# Patient Record
Sex: Male | Born: 1970 | Race: White | Hispanic: No | Marital: Married | Smoking: Never smoker
Health system: Southern US, Community
[De-identification: ages and names within clinical notes are randomized; demographics above are authoritative.]

## PROBLEM LIST (undated history)

## (undated) DIAGNOSIS — R3915 Urgency of urination: Secondary | ICD-10-CM

## (undated) DIAGNOSIS — E0590 Thyrotoxicosis, unspecified without thyrotoxic crisis or storm: Secondary | ICD-10-CM

## (undated) DIAGNOSIS — Z87442 Personal history of urinary calculi: Secondary | ICD-10-CM

## (undated) DIAGNOSIS — K5900 Constipation, unspecified: Secondary | ICD-10-CM

## (undated) HISTORY — PX: NO PAST SURGERIES: SHX2092

---

## 2018-10-24 ENCOUNTER — Encounter (HOSPITAL_COMMUNITY): Payer: Self-pay | Admitting: Emergency Medicine

## 2018-10-24 ENCOUNTER — Emergency Department (HOSPITAL_COMMUNITY)
Admission: EM | Admit: 2018-10-24 | Discharge: 2018-10-24 | Disposition: A | Discharge status: Home / Self Care | Payer: Managed Care, Other (non HMO) | Attending: Emergency Medicine | Admitting: Emergency Medicine

## 2018-10-24 ENCOUNTER — Other Ambulatory Visit: Payer: Self-pay

## 2018-10-24 ENCOUNTER — Emergency Department (HOSPITAL_COMMUNITY): Payer: Managed Care, Other (non HMO)

## 2018-10-24 DIAGNOSIS — R1032 Left lower quadrant pain: Secondary | ICD-10-CM | POA: Diagnosis present

## 2018-10-24 DIAGNOSIS — N130 Hydronephrosis with ureteropelvic junction obstruction: Secondary | ICD-10-CM | POA: Diagnosis not present

## 2018-10-24 DIAGNOSIS — R3129 Other microscopic hematuria: Secondary | ICD-10-CM

## 2018-10-24 DIAGNOSIS — E079 Disorder of thyroid, unspecified: Secondary | ICD-10-CM | POA: Insufficient documentation

## 2018-10-24 DIAGNOSIS — R112 Nausea with vomiting, unspecified: Secondary | ICD-10-CM | POA: Diagnosis not present

## 2018-10-24 DIAGNOSIS — N201 Calculus of ureter: Secondary | ICD-10-CM | POA: Insufficient documentation

## 2018-10-24 HISTORY — DX: Thyrotoxicosis, unspecified without thyrotoxic crisis or storm: E05.90

## 2018-10-24 LAB — COMPREHENSIVE METABOLIC PANEL
ALT: 41 U/L (ref 0–44)
AST: 25 U/L (ref 15–41)
Albumin: 4.5 g/dL (ref 3.5–5.0)
Alkaline Phosphatase: 51 U/L (ref 38–126)
Anion gap: 12 (ref 5–15)
BUN: 15 mg/dL (ref 6–20)
CO2: 24 mmol/L (ref 22–32)
Calcium: 9.4 mg/dL (ref 8.9–10.3)
Chloride: 103 mmol/L (ref 98–111)
Creatinine, Ser: 1.1 mg/dL (ref 0.61–1.24)
GFR calc Af Amer: >60 mL/min (ref >60)
GFR calc non Af Amer: >60 mL/min (ref >60)
Glucose, Bld: 103 mg/dL — ABNORMAL HIGH (ref 70–99)
Potassium: 3.7 mmol/L (ref 3.5–5.1)
Sodium: 139 mmol/L (ref 135–145)
Total Bilirubin: 2.3 mg/dL — ABNORMAL HIGH (ref 0.3–1.2)
Total Protein: 7.7 g/dL (ref 6.5–8.1)

## 2018-10-24 LAB — CBC
HCT: 48.3 % (ref 39.0–52.0)
Hemoglobin: 16.5 g/dL (ref 13.0–17.0)
MCH: 30.2 pg (ref 26.0–34.0)
MCHC: 34.2 g/dL (ref 30.0–36.0)
MCV: 88.3 fL (ref 80.0–100.0)
Platelets: 249 10*3/uL (ref 150–400)
RBC: 5.47 MIL/uL (ref 4.22–5.81)
RDW: 12.2 % (ref 11.5–15.5)
WBC: 12.6 10*3/uL — ABNORMAL HIGH (ref 4.0–10.5)
nRBC: 0 % (ref 0.0–0.2)

## 2018-10-24 LAB — URINALYSIS, ROUTINE W REFLEX MICROSCOPIC
Bacteria, UA: NONE SEEN
Bilirubin Urine: NEGATIVE
Glucose, UA: NEGATIVE mg/dL
Ketones, ur: NEGATIVE mg/dL
Leukocytes,Ua: NEGATIVE
Nitrite: NEGATIVE
Protein, ur: NEGATIVE mg/dL
Specific Gravity, Urine: 1.01 (ref 1.005–1.030)
pH: 5 (ref 5.0–8.0)

## 2018-10-24 LAB — LIPASE, BLOOD: Lipase: 27 U/L (ref 11–51)

## 2018-10-24 MED ORDER — KETOROLAC TROMETHAMINE 30 MG/ML IJ SOLN
30.0000 mg | Freq: Once | INTRAMUSCULAR | Status: AC
  Administered 2018-10-24: 05:00:00 30 mg via INTRAVENOUS
  Filled 2018-10-24: qty 1

## 2018-10-24 MED ORDER — MORPHINE SULFATE (PF) 4 MG/ML IV SOLN
4.0000 mg | Freq: Once | INTRAVENOUS | Status: DC
  Filled 2018-10-24: qty 1

## 2018-10-24 MED ORDER — TAMSULOSIN HCL 0.4 MG PO CAPS: 0.4000 mg | ORAL_CAPSULE | Freq: Every day | ORAL | 0 refills | Status: AC

## 2018-10-24 MED ORDER — ONDANSETRON 4 MG PO TBDP: 4.0000 mg | ORAL_TABLET | Freq: Three times a day (TID) | ORAL | 0 refills | Status: AC | PRN

## 2018-10-24 MED ORDER — SODIUM CHLORIDE 0.9 % IV BOLUS
1000.0000 mL | Freq: Once | INTRAVENOUS | Status: AC
  Administered 2018-10-24: 1000 mL via INTRAVENOUS

## 2018-10-24 MED ORDER — ONDANSETRON HCL 4 MG/2ML IJ SOLN
4.0000 mg | Freq: Once | INTRAMUSCULAR | Status: AC
  Administered 2018-10-24: 4 mg via INTRAVENOUS
  Filled 2018-10-24: qty 2

## 2018-10-24 MED ORDER — IOHEXOL 300 MG/ML  SOLN
100.0000 mL | Freq: Once | INTRAMUSCULAR | Status: AC | PRN
  Administered 2018-10-24: 100 mL via INTRAVENOUS

## 2018-10-24 MED ORDER — OXYCODONE-ACETAMINOPHEN 5-325 MG PO TABS: 1.0000 | ORAL_TABLET | ORAL | 0 refills | Status: AC | PRN

## 2018-10-24 NOTE — ED Triage Notes (Signed)
C/O of left sided flank pain with radiation into abdomen. Denies injury. No hx of kidney stones. States it feels like cramping and bloating in his stomach.

## 2018-10-24 NOTE — ED Notes (Signed)
Taken to CT at this time. 

## 2018-10-24 NOTE — Discharge Instructions (Signed)
Take the prescribed medication as directed.  Do not drive while taking pain medications. Follow-up with urology clinic-- can call for appt. Return to the ED for new or worsening symptoms-- uncontrolled pain, vomiting, fever, inability to urinate, etc.

## 2018-10-24 NOTE — ED Provider Notes (Signed)
[facility]   CSN: [id number] Arrival date & time: 10/24/18  0230    History   Chief Complaint Chief Complaint  Patient presents with   Abdominal Pain    HPI Austin Mcdowell is a 48 y.o. male.     The history is provided by the patient and medical records.  Abdominal Pain  Associated symptoms: nausea and vomiting     48 y.o. M with history of hyperthyroidism, presenting to the ED with left lower abdominal pain.  Patient reports this actually began on Monday, about 6 days ago.  Pain is been waxing and waning since that time but more constant over the past 24 hours.  Pain localized to left lower abdomen and into the left back.  States he tried modifying diet so had broth, crackers, and mostly liquids for about 2 days but did not really seem to help very much.  He has not had a BM in about 3 days.  He did vomit once earlier today.  He denies fever, chills, urinary symptoms.  No prior abdominal history or surgeries.  He tried taking some gas-x and pepto without much relief.  Did an e-visit with physician and was concerned about diverticulitis.  Past Medical History:  Diagnosis Date   Hyperthyroidism     There are no active problems to display for this patient.   History reviewed. No pertinent surgical history.      Home Medications    Prior to Admission medications   Not on File    Family History History reviewed. No pertinent family history.  Social History Social History   Tobacco Use   Smoking status: Never Smoker   Smokeless tobacco: Never Used  Substance Use Topics   Alcohol use: Yes    Comment: occas   Drug use: Never     Allergies   Patient has no known allergies.   Review of Systems Review of Systems  Gastrointestinal: Positive for abdominal pain, nausea and vomiting.  All other systems reviewed and are negative.    Physical Exam Updated Vital Signs Temp 97.8 F (36.6 C) (Oral)     Ht 6\' 2"  (1.88 m)    Wt 97.5 kg    BMI 27.60 kg/m   Physical Exam Vitals signs and nursing note reviewed.  Constitutional:      Appearance: He is well-developed.  HENT:     Head: Normocephalic and atraumatic.  Eyes:     Conjunctiva/sclera: Conjunctivae normal.     Pupils: Pupils are equal, round, and reactive to light.  Neck:     Musculoskeletal: Normal range of motion.  Cardiovascular:     Rate and Rhythm: Normal rate and regular rhythm.     Heart sounds: Normal heart sounds.  Pulmonary:     Effort: Pulmonary effort is normal.     Breath sounds: Normal breath sounds.  Abdominal:     General: Bowel sounds are normal.     Palpations: Abdomen is soft.     Tenderness: There is abdominal tenderness in the left lower quadrant.    Musculoskeletal: Normal range of motion.  Skin:    General: Skin is warm and dry.  Neurological:     Mental Status: He is alert and oriented to person, place, and time.      ED Treatments / Results  Labs (all labs ordered are listed, but only abnormal results are displayed) Labs Reviewed  COMPREHENSIVE METABOLIC PANEL - Abnormal; Notable for the following components:  Result Value   Glucose, Bld 103 (*)    Total Bilirubin 2.3 (*)    All other components within normal limits  CBC - Abnormal; Notable for the following components:   WBC 12.6 (*)    All other components within normal limits  URINALYSIS, ROUTINE W REFLEX MICROSCOPIC - Abnormal; Notable for the following components:   Hgb urine dipstick LARGE (*)    All other components within normal limits  LIPASE, BLOOD    EKG None  Radiology Ct Abdomen Pelvis W Contrast  Result Date: 10/24/2018 CLINICAL DATA:  48 y/o  M; left-sided flank pain. EXAM: CT ABDOMEN AND PELVIS WITH CONTRAST TECHNIQUE: Multidetector CT imaging of the abdomen and pelvis was performed using the standard protocol following bolus administration of intravenous contrast. CONTRAST:  OMNIPAQUE IOHEXOL 300  MG/ML  SOLN COMPARISON:  None. FINDINGS: Lower chest: No acute abnormality. Hepatobiliary: No focal liver abnormality is seen. No gallstones, gallbladder wall thickening, or biliary dilatation. Pancreas: Unremarkable. No pancreatic ductal dilatation or surrounding inflammatory changes. Spleen: Normal in size without focal abnormality. Adrenals/Urinary Tract: Mild left-sided hydronephrosis with a 4 mm stone at the ureteropelvic junction. Additional punctate non-obstructing stone within the left mid kidney. No stone is present downstream within the ureters or within the bladder. Adrenal glands are normal. No focal kidney lesion. Stomach/Bowel: Stomach is within normal limits. Appendix appears normal. No evidence of bowel wall thickening, distention, or inflammatory changes. Vascular/Lymphatic: Aortic atherosclerosis. No enlarged abdominal or pelvic lymph nodes. Reproductive: Prostate is unremarkable. Other: No abdominal wall hernia or abnormality. No abdominopelvic ascites. Musculoskeletal: No fracture is seen. Moderate discogenic degenerative changes at the L5-S1 level with loss of intervertebral disc space height and endplate marginal osteophytes. IMPRESSION: 1. Mild left-sided hydronephrosis with a 4 mm stone at the ureteropelvic junction. 2. Punctate nonobstructing stone in the left mid kidney. 3.  Aortic Atherosclerosis (ICD10-I70.0). Electronically Signed   By: Mitzi Hansen M.D.   On: 10/24/2018 05:54    Procedures Procedures (including critical care time)  Medications Ordered in ED Medications  morphine 4 MG/ML injection 4 mg (4 mg Intravenous Not Given 10/24/18 0321)  ondansetron (ZOFRAN) injection 4 mg (4 mg Intravenous Given 10/24/18 0324)  sodium chloride 0.9 % bolus 1,000 mL (0 mLs Intravenous Stopped 10/24/18 0613)  iohexol (OMNIPAQUE) 300 MG/ML solution 100 mL (100 mLs Intravenous Contrast Given 10/24/18 0358)  ketorolac (TORADOL) 30 MG/ML injection 30 mg (30 mg Intravenous Given  10/24/18 0431)     Initial Impression / Assessment and Plan / ED Course  I have reviewed the triage vital signs and the nursing notes.  Pertinent labs & imaging results that were available during my care of the patient were reviewed by me and considered in my medical decision making (see chart for details).  48 y.o. M here with left lower abdominal pain.  Waxing and waning for several days but more constant over the past 24 hours.  He is afebrile, non-toxic.  Some LLQ tenderness noted, no appreciable CVA tenderness but has reported some pain in this area recently.  Labs pending.  Plan for CT scan.  IVF and meds ordered.  Labs overall reassuring.  UA with blood noted, no signs of infection.  CT scan revealing 3mm left UPJ stone with mild hydro.  Results discussed with patient, he is much more comfortable after toradol.  Vitals remain stable.  Plan to discharge home with expectant management and symptomatic control.  Close follow-up with urology.  Rx percocet, zofran, flomax.  He  can return here for any new/acute changes-- pain out of proportion, fever, chills, difficulty urinating, uncontrolled vomiting, etc.  Final Clinical Impressions(s) / ED Diagnoses   Final diagnoses:  Left ureteral stone  Hematuria, microscopic    ED Discharge Orders         Ordered    oxyCODONE-acetaminophen (PERCOCET) 5-325 MG tablet  Every 4 hours PRN     10/24/18 0603    ondansetron (ZOFRAN ODT) 4 MG disintegrating tablet  Every 8 hours PRN     10/24/18 0603    tamsulosin (FLOMAX) 0.4 MG CAPS capsule  Daily after supper     10/24/18 0603           Garlon Hatchet, PA-C 10/24/18 Ulis Rias    Dione Booze, MD 10/24/18 2244

## 2018-10-27 ENCOUNTER — Other Ambulatory Visit: Payer: Self-pay | Admitting: Urology

## 2018-10-27 ENCOUNTER — Encounter (HOSPITAL_COMMUNITY): Payer: Self-pay | Admitting: *Deleted

## 2018-10-27 ENCOUNTER — Other Ambulatory Visit: Payer: Self-pay

## 2018-10-28 ENCOUNTER — Ambulatory Visit (HOSPITAL_COMMUNITY): Payer: Managed Care, Other (non HMO)

## 2018-10-28 ENCOUNTER — Ambulatory Visit (HOSPITAL_COMMUNITY)
Admission: RE | Admit: 2018-10-28 | Discharge: 2018-10-28 | Disposition: A | Discharge status: Home / Self Care | Payer: Managed Care, Other (non HMO) | Attending: Urology | Admitting: Urology

## 2018-10-28 ENCOUNTER — Encounter (HOSPITAL_COMMUNITY): Payer: Self-pay | Admitting: General Practice

## 2018-10-28 ENCOUNTER — Encounter (HOSPITAL_COMMUNITY)
Admission: RE | Disposition: A | Discharge status: Home / Self Care | Payer: Self-pay | Source: Home / Self Care | Attending: Urology

## 2018-10-28 DIAGNOSIS — Z87442 Personal history of urinary calculi: Secondary | ICD-10-CM | POA: Insufficient documentation

## 2018-10-28 DIAGNOSIS — Z8639 Personal history of other endocrine, nutritional and metabolic disease: Secondary | ICD-10-CM | POA: Diagnosis not present

## 2018-10-28 DIAGNOSIS — Z79899 Other long term (current) drug therapy: Secondary | ICD-10-CM | POA: Diagnosis not present

## 2018-10-28 DIAGNOSIS — N201 Calculus of ureter: Secondary | ICD-10-CM | POA: Insufficient documentation

## 2018-10-28 HISTORY — DX: Constipation, unspecified: K59.00

## 2018-10-28 HISTORY — PX: EXTRACORPOREAL SHOCK WAVE LITHOTRIPSY: SHX1557

## 2018-10-28 HISTORY — DX: Personal history of urinary calculi: Z87.442

## 2018-10-28 SURGERY — LITHOTRIPSY, ESWL
Anesthesia: LOCAL | Laterality: Left

## 2018-10-28 MED ORDER — SODIUM CHLORIDE 0.9 % IV SOLN
INTRAVENOUS | Status: DC
  Administered 2018-10-28: 12:00:00 via INTRAVENOUS

## 2018-10-28 MED ORDER — DIPHENHYDRAMINE HCL 25 MG PO CAPS
25.0000 mg | ORAL_CAPSULE | ORAL | Status: AC
  Administered 2018-10-28: 12:00:00 25 mg via ORAL
  Filled 2018-10-28: qty 1

## 2018-10-28 MED ORDER — DIAZEPAM 5 MG PO TABS
10.0000 mg | ORAL_TABLET | ORAL | Status: AC
  Administered 2018-10-28: 10 mg via ORAL
  Filled 2018-10-28: qty 2

## 2018-10-28 MED ORDER — CIPROFLOXACIN HCL 500 MG PO TABS
500.0000 mg | ORAL_TABLET | ORAL | Status: AC
  Administered 2018-10-28: 12:00:00 500 mg via ORAL
  Filled 2018-10-28: qty 1

## 2018-10-28 SURGICAL SUPPLY — 4 items
COVER SURGICAL LIGHT HANDLE (MISCELLANEOUS) ×2 IMPLANT
COVER WAND RF STERILE (DRAPES) IMPLANT
KIT TURNOVER KIT A (KITS) IMPLANT
TOWEL OR 17X26 10 PK STRL BLUE (TOWEL DISPOSABLE) ×2 IMPLANT

## 2018-10-28 NOTE — H&P (Signed)
H&P  Chief Complaint: Left kidney stone  History of Present Illness: 5 mm left upper ureteral stone with non progression and significant pain.  Past Medical History:  Diagnosis Date  . Constipation last bm 10-20-2018  . History of kidney stones   . Hyperthyroidism    normal last 3 yrs     Past Surgical History:  Procedure Laterality Date  . NO PAST SURGERIES      Home Medications:  Allergies as of 10/28/2018   No Known Allergies     Medication List    Notice   Cannot display discharge medications because the patient has not yet been admitted.     Allergies: No Known Allergies  History reviewed. No pertinent family history.  Social History:  reports that he has never smoked. He has never used smokeless tobacco. He reports current alcohol use. He reports that he does not use drugs.  ROS: A complete review of systems was performed.  All systems are negative except for pertinent findings as noted.  Physical Exam:  Vital signs in last 24 hours: Weight:  [97.5 kg] 97.5 kg (04/22 1303) Constitutional:  Alert and oriented, No acute distress Cardiovascular: Regular rate  Respiratory: Normal respiratory effort GI: Abdomen is soft, nontender, nondistended, no abdominal masses. No CVAT.  Genitourinary: Normal male phallus, testes are descended bilaterally and non-tender and without masses, scrotum is normal in appearance without lesions or masses, perineum is normal on inspection. Lymphatic: No lymphadenopathy Neurologic: Grossly intact, no focal deficits Psychiatric: Normal mood and affect  Laboratory Data:  No results for input(s): WBC, HGB, HCT, PLT in the last 72 hours.  No results for input(s): NA, K, CL, GLUCOSE, BUN, CALCIUM, CREATININE in the last 72 hours.  Invalid input(s): CO3   No results found for this or any previous visit (from the past 24 hour(s)). No results found for this or any previous visit (from the past 240 hour(s)).  Renal Function: Recent  Labs    10/24/18 0247  CREATININE 1.10   Estimated Creatinine Clearance: 93.4 mL/min (by C-G formula based on SCr of 1.1 mg/dL).  Radiologic Imaging: No results found.  Impression/Assessment:  Left upper ureteral stone  Plan:  Left ESL

## 2018-10-28 NOTE — Op Note (Signed)
See [facility] OP note scanned into chart. 

## 2018-10-28 NOTE — Interval H&P Note (Signed)
History and Physical Interval Note:  10/28/2018 1:16 PM  Austin Mcdowell [facility]  has presented today for surgery, with the diagnosis of LEFT URETERAL STONE.  The various methods of treatment have been discussed with the patient and family. After consideration of risks, benefits and other options for treatment, the patient has consented to  Procedure(s): EXTRACORPOREAL SHOCK WAVE LITHOTRIPSY (ESWL) (Left) as a surgical intervention.  The patient's history has been reviewed, patient examined, no change in status, stable for surgery.  I have reviewed the patient's chart and labs.  Questions were answered to the patient's satisfaction.     Bertram Millard Ashonti Leandro

## 2018-10-28 NOTE — Discharge Instructions (Signed)
See [facility] discharge instructions in chart.  

## 2018-10-29 ENCOUNTER — Encounter (HOSPITAL_COMMUNITY): Payer: Self-pay | Admitting: Urology

## 2018-11-11 ENCOUNTER — Other Ambulatory Visit: Payer: Self-pay | Admitting: Urology

## 2018-11-17 ENCOUNTER — Other Ambulatory Visit: Payer: Self-pay

## 2018-11-17 ENCOUNTER — Encounter (HOSPITAL_BASED_OUTPATIENT_CLINIC_OR_DEPARTMENT_OTHER): Payer: Self-pay | Admitting: Emergency Medicine

## 2018-11-17 NOTE — Progress Notes (Signed)
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

## 2018-11-17 NOTE — Progress Notes (Signed)
SPOKE W/  _ patient      SCREENING SYMPTOMS OF COVID 19:   COUGH--no  RUNNY NOSE--- no  SORE THROAT---no  NASAL CONGESTION----no  SNEEZING----no  SHORTNESS OF BREATH---no  DIFFICULTY BREATHING---no  TEMP >100.0 -----no  UNEXPLAINED BODY ACHES------no  CHILLS -------- no  HEADACHES ---------no   LOSS OF SMELL/ TASTE --------no    HAVE YOU OR ANY FAMILY MEMBER TRAVELLED PAST 14 DAYS OUT OF THE   COUNTY---lives in [place] and goes to [facility] for dr appts STATE----no COUNTRY----no  HAVE YOU OR ANY FAMILY MEMBER BEEN EXPOSED TO ANYONE WITH COVID 19?    no

## 2018-11-23 ENCOUNTER — Inpatient Hospital Stay (HOSPITAL_COMMUNITY): Admission: RE | Admit: 2018-11-23 | Payer: Managed Care, Other (non HMO) | Source: Ambulatory Visit

## 2018-11-26 ENCOUNTER — Ambulatory Visit (HOSPITAL_BASED_OUTPATIENT_CLINIC_OR_DEPARTMENT_OTHER): Admission: RE | Admit: 2018-11-26 | Payer: Managed Care, Other (non HMO) | Source: Home / Self Care | Admitting: Urology

## 2018-11-26 HISTORY — DX: Urgency of urination: R39.15

## 2018-11-26 SURGERY — CYSTOURETEROSCOPY, WITH RETROGRADE PYELOGRAM AND STENT INSERTION
Anesthesia: General | Laterality: Left

## 2019-03-04 ENCOUNTER — Other Ambulatory Visit: Payer: Self-pay | Admitting: Gastroenterology

## 2019-03-04 DIAGNOSIS — R1013 Epigastric pain: Secondary | ICD-10-CM

## 2019-03-04 DIAGNOSIS — R110 Nausea: Secondary | ICD-10-CM

## 2019-03-07 ENCOUNTER — Other Ambulatory Visit (HOSPITAL_COMMUNITY): Payer: Self-pay | Admitting: Cardiology

## 2019-03-07 DIAGNOSIS — R079 Chest pain, unspecified: Secondary | ICD-10-CM

## 2019-03-16 ENCOUNTER — Ambulatory Visit (HOSPITAL_COMMUNITY): Payer: Managed Care, Other (non HMO)

## 2019-03-16 ENCOUNTER — Other Ambulatory Visit (HOSPITAL_COMMUNITY): Payer: Managed Care, Other (non HMO)

## 2019-03-16 ENCOUNTER — Ambulatory Visit (HOSPITAL_COMMUNITY)
Admission: RE | Admit: 2019-03-16 | Discharge: 2019-03-16 | Disposition: A | Discharge status: Home / Self Care | Payer: Managed Care, Other (non HMO) | Source: Ambulatory Visit | Attending: Cardiology | Admitting: Cardiology

## 2019-03-16 ENCOUNTER — Other Ambulatory Visit: Payer: Self-pay

## 2019-03-16 DIAGNOSIS — R079 Chest pain, unspecified: Secondary | ICD-10-CM | POA: Insufficient documentation

## 2019-03-16 MED ORDER — REGADENOSON 0.4 MG/5ML IV SOLN
0.4000 mg | Freq: Once | INTRAVENOUS | Status: AC
  Administered 2019-03-16: 10:00:00 0.4 mg via INTRAVENOUS

## 2019-03-16 MED ORDER — TECHNETIUM TC 99M TETROFOSMIN IV KIT
10.0000 | PACK | Freq: Once | INTRAVENOUS | Status: AC | PRN
  Administered 2019-03-16: 09:00:00 10 via INTRAVENOUS

## 2019-03-16 MED ORDER — REGADENOSON 0.4 MG/5ML IV SOLN
INTRAVENOUS | Status: AC
  Administered 2019-03-16: 0.4 mg via INTRAVENOUS
  Filled 2019-03-16: qty 5

## 2019-03-16 MED ORDER — TECHNETIUM TC 99M TETROFOSMIN IV KIT
30.0000 | PACK | Freq: Once | INTRAVENOUS | Status: AC | PRN
  Administered 2019-03-16: 10:00:00 30 via INTRAVENOUS

## 2019-03-28 ENCOUNTER — Ambulatory Visit (HOSPITAL_COMMUNITY)
Admission: RE | Admit: 2019-03-28 | Discharge: 2019-03-28 | Disposition: A | Discharge status: Home / Self Care | Payer: Managed Care, Other (non HMO) | Source: Ambulatory Visit | Attending: Gastroenterology | Admitting: Gastroenterology

## 2019-03-28 ENCOUNTER — Encounter (HOSPITAL_COMMUNITY)
Admission: RE | Admit: 2019-03-28 | Discharge: 2019-03-28 | Disposition: A | Discharge status: Home / Self Care | Payer: Managed Care, Other (non HMO) | Source: Ambulatory Visit | Attending: Gastroenterology | Admitting: Gastroenterology

## 2019-03-28 ENCOUNTER — Other Ambulatory Visit: Payer: Self-pay

## 2019-03-28 DIAGNOSIS — R1013 Epigastric pain: Secondary | ICD-10-CM | POA: Insufficient documentation

## 2019-03-28 DIAGNOSIS — R110 Nausea: Secondary | ICD-10-CM | POA: Insufficient documentation

## 2019-03-28 MED ORDER — TECHNETIUM TC 99M MEBROFENIN IV KIT
5.3000 | PACK | Freq: Once | INTRAVENOUS | Status: AC | PRN
  Administered 2019-03-28: 5.3 via INTRAVENOUS

## 2021-02-26 IMAGING — NM NM HEPATO W/GB/PHARM/[PERSON_NAME]
2 series · 12 of 12 positions shown · non-contrast
Comparison: None

CLINICAL DATA: Epigastric abdominal pain and nausea since January 2019

EXAM:
NUCLEAR MEDICINE HEPATOBILIARY IMAGING WITH GALLBLADDER EF
TECHNIQUE: Sequential images of the abdomen were obtained [DATE] minutes
following intravenous administration of radiopharmaceutical. After
oral ingestion of Ensure, gallbladder ejection fraction was
determined. At 60 min, normal ejection fraction is greater than 33%.
RADIOPHARMACEUTICALS:  5.28 mCi 3c-LLm  Choletec IV

[Series 1: biliary · 4.14mm/px · 6 of 60 frames shown]
[frame 6/60]
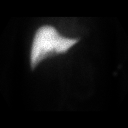
[frame 16/60]
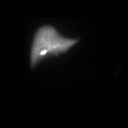
[frame 26/60]
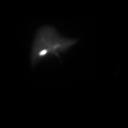
[frame 36/60]
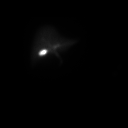
[frame 46/60]
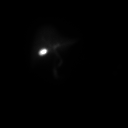
[frame 56/60]
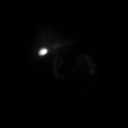

[Series 2: gbef · 4.14mm/px · 6 of 60 frames shown]
[frame 6/60]
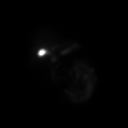
[frame 16/60]
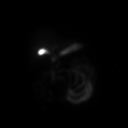
[frame 26/60]
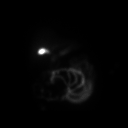
[frame 36/60]
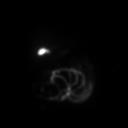
[frame 46/60]
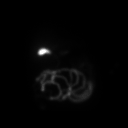
[frame 56/60]
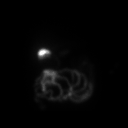

[12 of 12 positions shown; findings below may reference images not displayed]

FINDINGS: Normal tracer extraction from bloodstream indicating normal
hepatocellular function.

Normal excretion of tracer into biliary tree.

Gallbladder visualized at 15 min.

Small bowel visualized at 44 min.

No hepatic retention of tracer.

Subjectively normal emptying of tracer from gallbladder following
fatty meal stimulation.

Calculated gallbladder ejection fraction is 47%, normal.

Patient reported no symptoms following Ensure ingestion.

Normal gallbladder ejection fraction following Ensure ingestion is
greater than 33% at 1 hour.
IMPRESSION: Normal exam.
